# Patient Record
Sex: Male | Born: 1955 | Race: Black or African American | Hispanic: No | Marital: Single | Smoking: Current every day smoker
Health system: Southern US, Community
[De-identification: ages and names within clinical notes are randomized; demographics above are authoritative.]

## PROBLEM LIST (undated history)

## (undated) DIAGNOSIS — E119 Type 2 diabetes mellitus without complications: Secondary | ICD-10-CM

## (undated) HISTORY — DX: Type 2 diabetes mellitus without complications: E11.9

---

## 1998-05-21 ENCOUNTER — Ambulatory Visit (HOSPITAL_COMMUNITY): Admission: RE | Admit: 1998-05-21 | Discharge: 1998-05-21 | Payer: Self-pay | Admitting: Neurosurgery

## 2003-10-11 ENCOUNTER — Inpatient Hospital Stay (HOSPITAL_COMMUNITY): Admission: AD | Admit: 2003-10-11 | Discharge: 2003-10-13 | Payer: Self-pay | Admitting: Family Medicine

## 2004-01-20 ENCOUNTER — Encounter: Admission: RE | Admit: 2004-01-20 | Discharge: 2004-01-20 | Payer: Self-pay | Admitting: Internal Medicine

## 2004-08-27 ENCOUNTER — Ambulatory Visit: Payer: Self-pay | Admitting: Gastroenterology

## 2004-09-07 ENCOUNTER — Ambulatory Visit (HOSPITAL_COMMUNITY): Admission: RE | Admit: 2004-09-07 | Discharge: 2004-09-07 | Payer: Self-pay | Admitting: Gastroenterology

## 2004-09-07 ENCOUNTER — Encounter (INDEPENDENT_AMBULATORY_CARE_PROVIDER_SITE_OTHER): Payer: Self-pay | Admitting: *Deleted

## 2004-11-19 ENCOUNTER — Ambulatory Visit: Payer: Self-pay | Admitting: Gastroenterology

## 2004-12-24 ENCOUNTER — Ambulatory Visit: Payer: Self-pay | Admitting: Gastroenterology

## 2005-01-03 ENCOUNTER — Ambulatory Visit: Payer: Self-pay | Admitting: Gastroenterology

## 2005-01-17 ENCOUNTER — Ambulatory Visit: Payer: Self-pay | Admitting: Gastroenterology

## 2005-01-31 ENCOUNTER — Ambulatory Visit: Payer: Self-pay | Admitting: Internal Medicine

## 2005-02-21 ENCOUNTER — Ambulatory Visit (HOSPITAL_COMMUNITY): Admission: RE | Admit: 2005-02-21 | Discharge: 2005-02-21 | Payer: Self-pay | Admitting: Internal Medicine

## 2005-02-21 ENCOUNTER — Ambulatory Visit: Payer: Self-pay | Admitting: Internal Medicine

## 2005-03-21 ENCOUNTER — Ambulatory Visit: Payer: Self-pay | Admitting: Internal Medicine

## 2005-04-18 ENCOUNTER — Ambulatory Visit: Payer: Self-pay | Admitting: Internal Medicine

## 2005-05-16 ENCOUNTER — Ambulatory Visit: Payer: Self-pay | Admitting: Gastroenterology

## 2010-11-10 ENCOUNTER — Encounter: Payer: Self-pay | Admitting: Gastroenterology

## 2010-11-11 ENCOUNTER — Encounter: Payer: Self-pay | Admitting: Gastroenterology

## 2011-03-08 NOTE — H&P (Signed)
NAMEDISHON, KEHOE                          ACCOUNT NO.:  [id number]   MEDICAL RECORD NO.:  [id number]                   PATIENT TYPE:  EMS   LOCATION:  URG                                  FACILITY:  [facility]   PHYSICIAN:  Deirdre Peer. Polite, M.D.              DATE OF BIRTH:  Dec 16, 1955   DATE OF ADMISSION:  10/11/2003  DATE OF DISCHARGE:                                HISTORY & PHYSICAL   CHIEF COMPLAINT:  No energy.   HISTORY OF PRESENT ILLNESS:  This 55 year old black male whose only  significant past medical history of left jaw abscess which was treated with  Penicillin December 14.  He was given 3 tabs a day of a pain pill.  Otherwise he has no other past medical history.  He does admit to having  decreased energy, weight loss of approximately 50 pounds, decreased  appetite, polyuria and polydipsia for approximately 2 months.  The patient  presented to urgent care for evaluation and was found to have an elevated  CBG of greater than 400; therefore, he was referred to the [facility] ED for further evaluation.  In the ED the patient was given IV  fluids and labs were ordered which confirmed hyperglycemia and hyponatremia.  Because of the patient's sallow appearance and history of weight loss [facility]  hospitalist was called for further evaluation and admission.  At the time of  my evaluation the patient was alert and oriented in no apparent distress,  but appeared in a very weakened state.  He stated that he had not been  feeling good for a couple of months.  He thought that it was related to his  jaw abscess. He states that his appetite has been down. He has occasionally  had some nausea and overall is just not feeling good.  Again, he does admit  to polyuria, polydipsia, but not recently, just feeling very weak.  He  denied any fever or chills. No vomiting.  No abdominal pain.  Admitted to  occasional constipation, but no blood in the stool or urine.  The patient  did  admit to having to occasionally use hemorrhoidal medications.   PAST MEDICAL HISTORY:  As stated above.   MEDICATIONS ON ADMISSION:  Penicillin 3 times a day since December 14, and  occasions pain pill.   SOCIAL HISTORY:  The patient denies tobacco for the past couple of months  since he has not been feeling good.  Prior to that he had been smoking 1/2  pack per day.  Again, also denies alcohol over the past couple of months  since he has not felt good.  Prior to that he states that he drank about a 6-  pack a day.  Denies any surgeries.   ALLERGIES:  No known drug allergies.   FAMILY HISTORY:  States his mother had a history of CVA and hypertension.  Father had a history  of coronary artery disease.  States that he has 3  brothers and 2 sisters who are healthy.   PHYSICAL EXAMINATION:  GENERAL:  The patient was alert and oriented x3 in  mild distress.  VITAL SIGNS:  Temperature on admission 100.2; at the time of my evaluation  98.6.  BP 106/79.  Pulse 99, respiratory rate is 16.  The patient saturation  is 98%.  HEENT:  Anicteric sclerae.  Dry oral mucosa.  The patient has fullness over  his left parotid gland.  No discharge could be expressed from the duct.  There was no obvious internal abscess at the gum line.  The patient has poor  dentition.  CHEST:  Clear to auscultation bilaterally.  No rales, no wheezes.  CARDIOVASCULAR:  Regular rate and rhythm.  ABDOMEN:  Soft, nontender, no hepatosplenomegaly.  EXTREMITIES:  No edema.  2+ pulse.  RECTAL:  Exam deferred.   LABORATORY DATA:  BMET:  Sodium 127, potassium 4.5, chloride 87, BUN 21,  glucose of 437.  No other labs are available at this time.   ASSESSMENT AND PLAN:  1. Hyperglycemia, probably newly diagnosed diabetes.  2. Dehydration.  3. History of oral abscess, cannot exclude parotid gland infection.  4. History of weight loss 50 pounds.  5. Hyponatremia.   RECOMMENDATIONS:  The patient will be admitted to a nursing  floor bed for  evaluation and treatment; will be given judicious IV fluid.  Will have  screening lab work; will obtain a CBC with differential; a CMET, a UA, CNS,  as well as TSH, hemoglobin A1c and a lipid panel.  The patient will also  have a chest x-ray ordered and may consider ordering imaging of his left  parotid gland as there is fullness and discomfort, however, this may just be  a mild inflammation of the parotid gland secondary to severe dehydration.  Will make further recommendations after review of the above studies.                                                Deirdre Peer. Polite, M.D.    RDP/MEDQ  D:  10/11/2003  T:  10/11/2003  Job:  [id number]

## 2011-03-08 NOTE — Discharge Summary (Signed)
[name], [name]                          ACCOUNT NO.:  [id number]   MEDICAL RECORD NO.:  [id number]                   PATIENT TYPE:  INP   LOCATION:  5741                                 FACILITY:  [facility]   PHYSICIAN:  Deirdre Peer. Polite, M.D.              DATE OF BIRTH:  10-Dec-1955   DATE OF ADMISSION:  10/11/2003  DATE OF DISCHARGE:  10/13/2003                                 DISCHARGE SUMMARY   DISCHARGE DIAGNOSES:  1. Diabetes, newly diagnosed.  Hemoglobin A1C 14.8.  2. Elevated liver function tests with AST 121, ALT 157, for outpatient     followup.  3. Azotemia, resolved.  Creatinine on admission 1.7, now 1.3.  4. Submandibular swelling of gland versus abscess.  The patient is afebrile,     to continue outpatient management. Follow up with outpatient physician.   DISCHARGE MEDICATIONS:  1. Insulin 70/30, 15 units b.i.d.  2. Glyburide 5 mg p.o. b.i.d.  3. Recommend patient take baby aspirin 81 mg 1 p.o. daily.  4. The patient is to continue outpatient antibiotics, penicillin VK 500 mg 1     p.o. t.i.d.   CONSULTATIONS:  None.   STUDIES:  1. Hemoglobin A1C 14.8.  2. CBC: White count 4.2, hemoglobin 13.8, platelets 141.  3. BMET significant for creatinine of 1.7 on admission. Followup creatinine     1.3.  AST 120, ALT 157.  Total cholesterol 175, HDL 20, LDL 52.  UA     significant for greater than 1000 glucose.  Leukocyte esterase and     nitrite negative.  4. Chest x-ray:  No acute disease.   DISPOSITION:  The patient discharged to home in stable condition with  outpatient followup.   HISTORY OF PRESENT ILLNESS:  A 55 year old male with past medical history  significant for ETOH and tobacco use, presented to the ED with CBG greater  than 400.  The patient did not carry the diagnosis of diabetes.  Also of  note, the patient had been taking medication for a left jaw abscess;  however, upon further review, had not really been taking his antibiotics.  Because of the  newly diagnosed diabetes and the patient appearing extremely  dehydrated, admission was deemed necessary for further evaluation and  treatment.  Please see dictated HPI for further details of History of  Present Illness.   HOSPITAL COURSE:  #1.  NEWLY DIAGNOSED DIABETES:  The patient was admitted  to a medicine floor bed for evaluation and treatment of newly diagnosed  diabetes.  The patient had screening labs: CBC, BMET, hemoglobin A1C, and  lipid panel. Lab results as stated in the Studies section.  The patient was  given IV fluids and insulin.  Initially sliding scale insulin, ultimately  converted to 70/30 insulin, 15 units b.i.d.  The patient had dramatic  improvement in his symptoms of malaise and weakness, tolerated his diet.  On  the next day of  admission, the patient was hemodynamically stable; however,  CBGs were still greater than optimal in the mid 200 range.  At this time,  the patient's medications will continue to be titrated; insulin 70/30 will  stay at 20 units b.i.d. and will add oral hyperglycemics.  His medications  will be further titrated on an outpatient basis by his primary MD.   #2.  QUESTIONABLE LEFT JAW ABSCESS:  Per exam, the patient had fullness in  the left parotid gland which improved dramatically post admission as well as  fullness in the left submandibular gland area.  There was no obvious  discharge.  The patient may have an abscess in that area.  The patient has  been on antibiotics on outpatient basis, taken for only two days.  In the  interim, he was in the hospital on IV clindamycin.  The patient has remained  afebrile without leukocytosis.  The patient was asked to continue penicillin  VK 500 mg t.i.d. x 10 days and to follow up with his outpatient MD.   #3.  MILDLY ELEVATED LIVER FUNCTION TESTS:  As stated on admission, the  patient has history of alcohol use daily, approximately 6 beers a day.  This  is most likely related to that.  The patient is  without nausea and vomiting,  without jaundice.  This will be followed up on an outpatient basis.  At this  time, the patient is medically stable at discharge.                                                Deirdre Peer. Polite, M.D.    RDP/MEDQ  D:  10/13/2003  T:  10/15/2003  Job:  [id number]

## 2021-11-27 ENCOUNTER — Ambulatory Visit
Admission: RE | Admit: 2021-11-27 | Discharge: 2021-11-27 | Disposition: A | Discharge status: Home / Self Care | Payer: Self-pay | Source: Ambulatory Visit | Attending: *Deleted | Admitting: *Deleted

## 2021-11-27 ENCOUNTER — Other Ambulatory Visit: Payer: Self-pay | Admitting: *Deleted

## 2021-11-27 DIAGNOSIS — M25512 Pain in left shoulder: Secondary | ICD-10-CM

## 2022-02-04 ENCOUNTER — Other Ambulatory Visit (HOSPITAL_COMMUNITY): Payer: Self-pay | Admitting: Family Medicine

## 2022-02-05 ENCOUNTER — Other Ambulatory Visit (HOSPITAL_COMMUNITY): Payer: Self-pay | Admitting: Family Medicine

## 2022-02-05 DIAGNOSIS — E1165 Type 2 diabetes mellitus with hyperglycemia: Secondary | ICD-10-CM

## 2022-02-22 ENCOUNTER — Ambulatory Visit (HOSPITAL_COMMUNITY)
Admission: RE | Admit: 2022-02-22 | Discharge: 2022-02-22 | Disposition: A | Discharge status: Home / Self Care | Payer: Medicare Other | Source: Ambulatory Visit | Attending: Family Medicine | Admitting: Family Medicine

## 2022-02-22 DIAGNOSIS — E1165 Type 2 diabetes mellitus with hyperglycemia: Secondary | ICD-10-CM | POA: Diagnosis not present

## 2022-02-22 DIAGNOSIS — I517 Cardiomegaly: Secondary | ICD-10-CM | POA: Insufficient documentation

## 2022-02-22 DIAGNOSIS — I5030 Unspecified diastolic (congestive) heart failure: Secondary | ICD-10-CM | POA: Diagnosis not present

## 2022-02-22 LAB — ECHOCARDIOGRAM COMPLETE
AV Peak grad: 5.1 mmHg
Ao pk vel: 1.13 m/s
Area-P 1/2: 2.97 cm2
S' Lateral: 2.6 cm

## 2022-10-29 ENCOUNTER — Other Ambulatory Visit: Payer: Self-pay | Admitting: *Deleted

## 2022-10-29 DIAGNOSIS — M79605 Pain in left leg: Secondary | ICD-10-CM

## 2022-11-12 ENCOUNTER — Ambulatory Visit: Payer: Medicare Other | Admitting: Physician Assistant

## 2022-11-12 ENCOUNTER — Ambulatory Visit (HOSPITAL_COMMUNITY)
Admission: RE | Admit: 2022-11-12 | Discharge: 2022-11-12 | Disposition: A | Discharge status: Home / Self Care | Payer: Medicare Other | Source: Ambulatory Visit | Attending: Vascular Surgery | Admitting: Vascular Surgery

## 2022-11-12 VITALS — BP 116/70 | HR 72 | Temp 98.1°F | Resp 20 | Ht 67.0 in | Wt 160.0 lb

## 2022-11-12 DIAGNOSIS — M79605 Pain in left leg: Secondary | ICD-10-CM | POA: Insufficient documentation

## 2022-11-12 DIAGNOSIS — I83813 Varicose veins of bilateral lower extremities with pain: Secondary | ICD-10-CM

## 2022-11-12 DIAGNOSIS — M79604 Pain in right leg: Secondary | ICD-10-CM | POA: Diagnosis not present

## 2022-11-12 DIAGNOSIS — I8393 Asymptomatic varicose veins of bilateral lower extremities: Secondary | ICD-10-CM

## 2022-11-12 NOTE — Progress Notes (Signed)
Requested by:  Ellyn Hack, MD [address] [place],  [place] [address]  Reason for consultation: varicose veins    History of Present Illness   [name] is a 67 y.o. (28-Dec-1955) male who presents for evaluation of varicose veins.  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  The veins in his right leg are worse than the left.  They occasionally cause him aching pain at the end of the day after being on his feet.  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  He states that his legs will also feel tired at the end of the day.  He denies any noticeable leg swelling.  He occasionally wears knee-high compression stockings and experiences benefit from them.  He does not elevate his legs.  He denies any history of ulcers, DVT, bleeding, or history of previous vein procedures.  Past Medical History:  Diagnosis Date   Diabetes mellitus without complication (HCC)     History reviewed. No pertinent surgical history.  Social History   Socioeconomic History   Marital status: Married    Spouse name: Not on file   Number of children: Not on file   Years of education: Not on file   Highest education level: Not on file  Occupational History   Not on file  Tobacco Use   Smoking status: Every Day    Packs/day: 1.00    Types: Cigarettes   Smokeless tobacco: Never  Substance and Sexual Activity   Alcohol use: Yes   Drug use: Not Currently   Sexual activity: Not on file  Other Topics Concern   Not on file  Social History Narrative   Not on file   Social Determinants of Health   Financial Resource Strain: Not on file  Food Insecurity: Not on file  Transportation Needs: Not on file  Physical Activity: Not on file  Stress: Not on file  Social Connections: Not on file  Intimate Partner Violence: Not on file   History reviewed. No pertinent family history.  Current Outpatient Medications  Medication Sig Dispense Refill    gabapentin (NEURONTIN) 100 MG capsule Take 100 mg by mouth 2 (two) times daily.     metFORMIN (GLUCOPHAGE) 1000 MG tablet SMARTSIG:1 Tablet(s) By Mouth Morning-Evening     No current facility-administered medications for this visit.    No Known Allergies  REVIEW OF SYSTEMS (negative unless checked):   Cardiac:  []  Chest pain or chest pressure? []  Shortness of breath upon activity? []  Shortness of breath when lying flat? []  Irregular heart rhythm?  Vascular:  []  Pain in calf, thigh, or hip brought on by walking? []  Pain in feet at night that wakes you up from your sleep? []  Blood clot in your veins? []  Leg swelling?  Pulmonary:  []  Oxygen at home? []  Productive cough? []  Wheezing?  Neurologic:  []  Sudden weakness in arms or legs? []  Sudden numbness in arms or legs? []  Sudden onset of difficult speaking or slurred speech? []  Temporary loss of vision in one eye? []  Problems with dizziness?  Gastrointestinal:  []  Blood in stool? []  Vomited blood?  Genitourinary:  []  Burning when urinating? []  Blood in urine?  Psychiatric:  []  Major depression  Hematologic:  []  Bleeding problems? []  Problems with blood clotting?  Dermatologic:  []  Rashes or ulcers?  Constitutional:  []  Fever or chills?  Ear/Nose/Throat:  []  Change in hearing? []   Nose bleeds? []  Sore throat?  Musculoskeletal:  []  Back pain? []  Joint pain? []  Muscle pain?   Physical Examination     Vitals:   11/12/22 1249  BP: 116/70  Pulse: 72  Resp: 20  Temp: 98.1 F (36.7 C)  SpO2: 100%  Weight: 160 lb (72.6 kg)  Height: 5\' 7"  (1.702 m)   Body mass index is 25.06 kg/m.  General:  WDWN in NAD; vital signs documented above Gait: Not observed HENT: WNL, normocephalic Pulmonary: normal non-labored breathing Cardiac: regular rate and rhythm Abdomen: soft, NT, no masses Skin: without rashes Vascular Exam/Pulses: bilateral feet warm and well perfused Extremities: Numerous varicose veins  in bilateral calves, right greater than left.  No reticular veins, no edema, no stasis pigmentation, no lipodermatosclerosis, no ulcers  Musculoskeletal: no muscle wasting or atrophy  Neurologic: A&O X 3;  No focal weakness or paresthesias are detected Psychiatric:  The pt has Normal affect.  Non-invasive Vascular Imaging   RLE Venous Insufficiency Duplex (11/12/2022):   +--------------+---------+------+-----------+------------+--------+  RIGHT        Reflux NoRefluxReflux TimeDiameter cmsComments                          Yes                                   +--------------+---------+------+-----------+------------+--------+  CFV                    yes   >1 second                       +--------------+---------+------+-----------+------------+--------+  FV prox                 yes   >1 second                       +--------------+---------+------+-----------+------------+--------+  FV mid                  yes   >1 second                       +--------------+---------+------+-----------+------------+--------+  FV dist       no                                              +--------------+---------+------+-----------+------------+--------+  Popliteal    no                                              +--------------+---------+------+-----------+------------+--------+  GSV at [facility]              yes    >500 ms      1.22              +--------------+---------+------+-----------+------------+--------+  GSV prox thigh          yes    >500 ms      0.81              +--------------+---------+------+-----------+------------+--------+  GSV mid thigh           yes    >  500 ms      1.4               +--------------+---------+------+-----------+------------+--------+  GSV dist thigh          yes    >500 ms      0.75              +--------------+---------+------+-----------+------------+--------+  GSV at knee             yes     >500 ms      0.73              +--------------+---------+------+-----------+------------+--------+  GSV prox calf           yes    >500 ms      0.61              +--------------+---------+------+-----------+------------+--------+  SSV Pop Fossa no                           0.228              +--------------+---------+------+-----------+------------+--------+  SSV prox calf           yes    >500 ms     0.306              +--------------+---------+------+-----------+------------+--------+  SSV mid calf            yes    >500 ms     0.365              +--------------+---------+------+-----------+------------+--------+    Medical Decision Making   [name] is a 67 y.o. male who presents with painful varicose veins  Based on the patient's duplex study, there is reflux in the right common femoral vein, femoral vein, greater saphenous vein beginning at the saphenofemoral junction and extending to the proximal calf, and small saphenous vein in the proximal and mid calf.  The rest of the superficial and deep venous system is competent.  There is no evidence of DVT or SVT. 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  He experiences aching in these veins at the end of the day after being on his feet.  He experiences relief with compression stockings.  He wanted to ensure there was nothing else he could do to make his symptoms better. I believe he would benefit from continued use of compression stockings to help with aching in his varicose veins.  I also educated him on the proper way to elevate his legs to help with aching and heaviness. He can follow-up with our office as needed.   [name] Ivan Alexanders, PA-C [facility] Office: [phone]  11/12/2022, 4:23 PM  Clinic MD: Carlis Abbott

## 2023-05-01 IMAGING — CR DG SHOULDER 2+V*L*
4 series · 4 of 4 positions shown · non-contrast
Comparison: No recent prior.

CLINICAL DATA: Left shoulder pain.

EXAM:
LEFT SHOULDER - 2+ VIEW

[w shoulder ap internal left *]
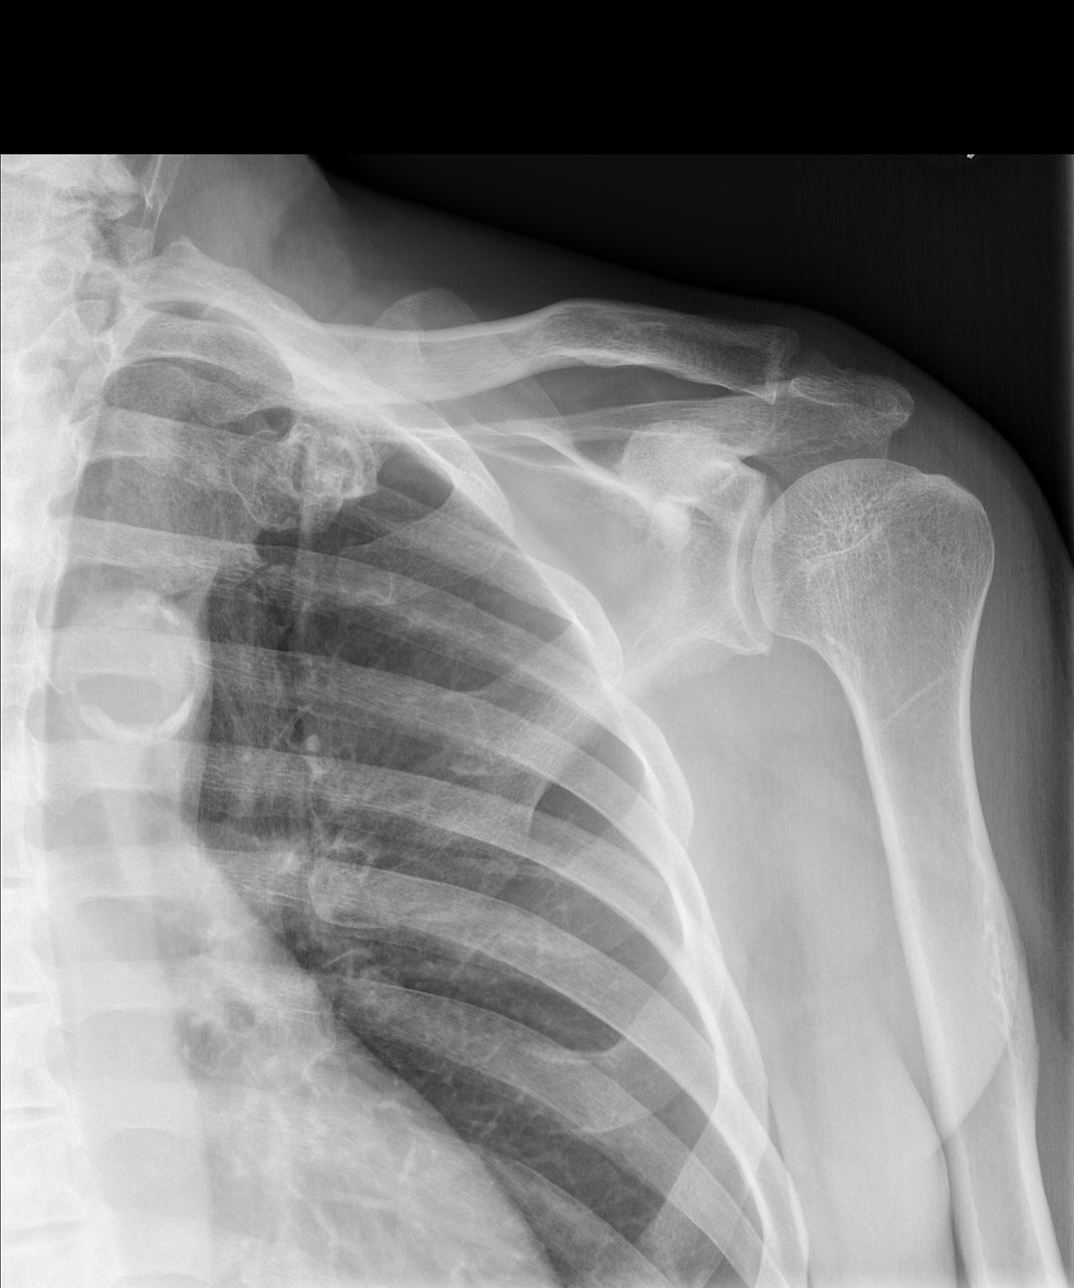

[w shoulder y view left * (1 of 2)]
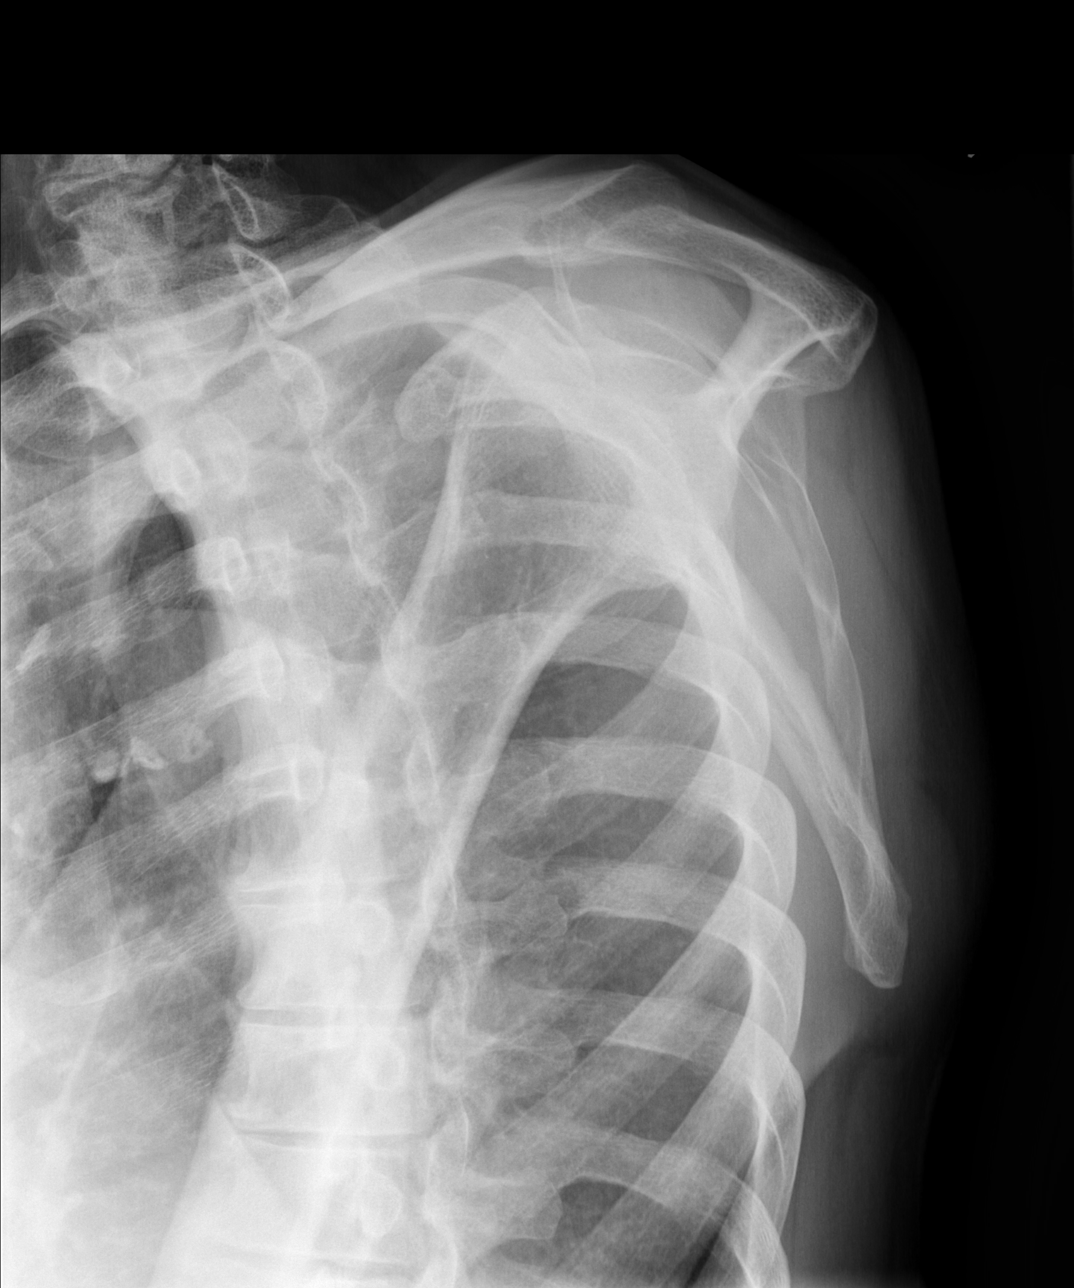

[w shoulder y view left * (2 of 2)]
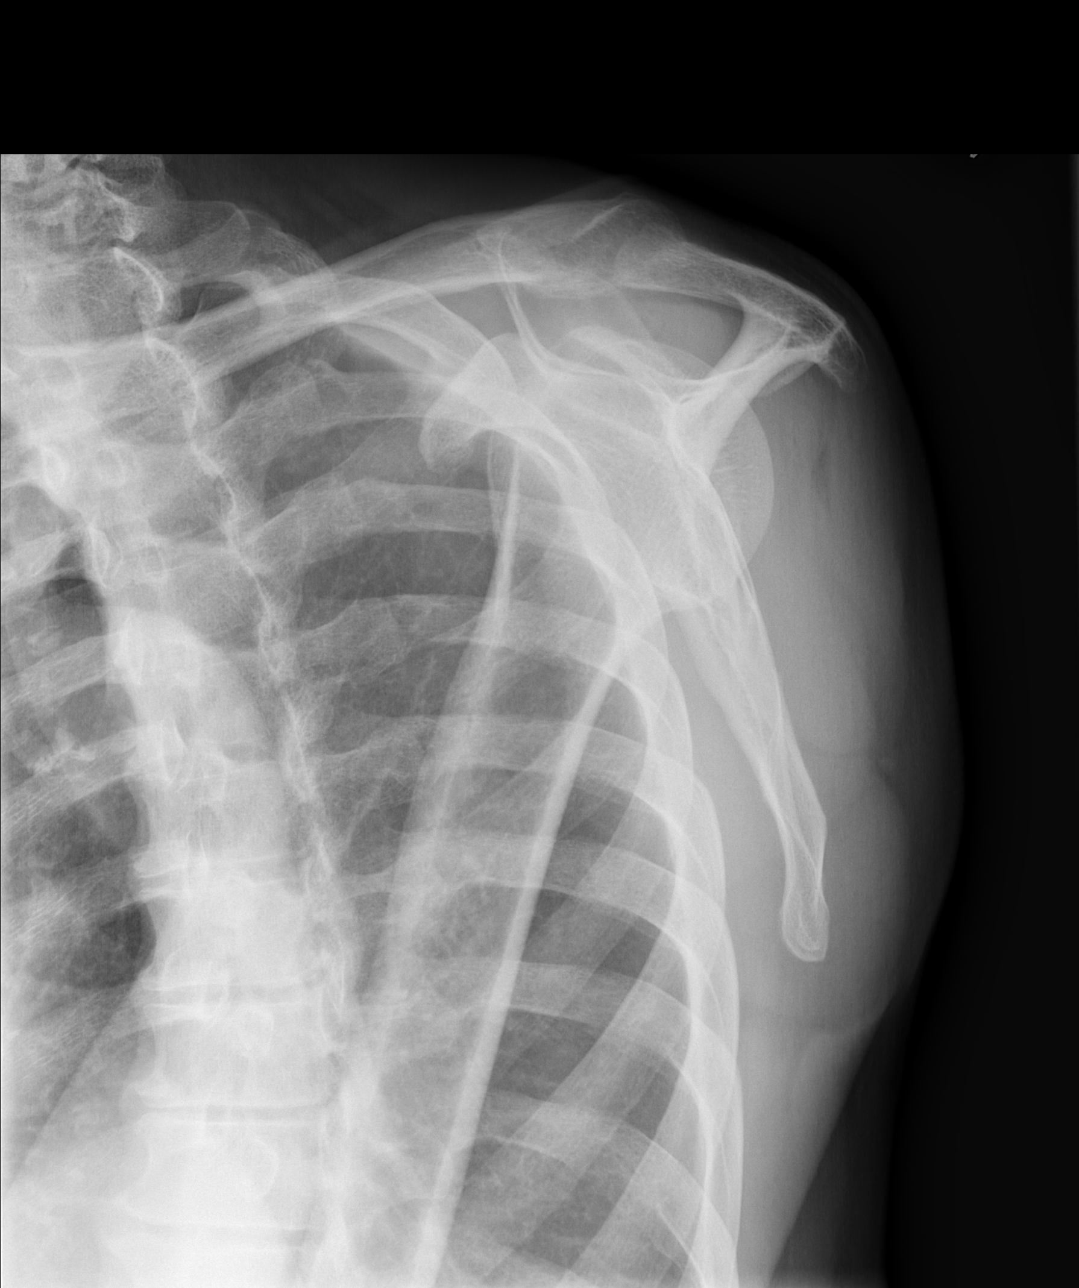

[w shoulder axillary left *]
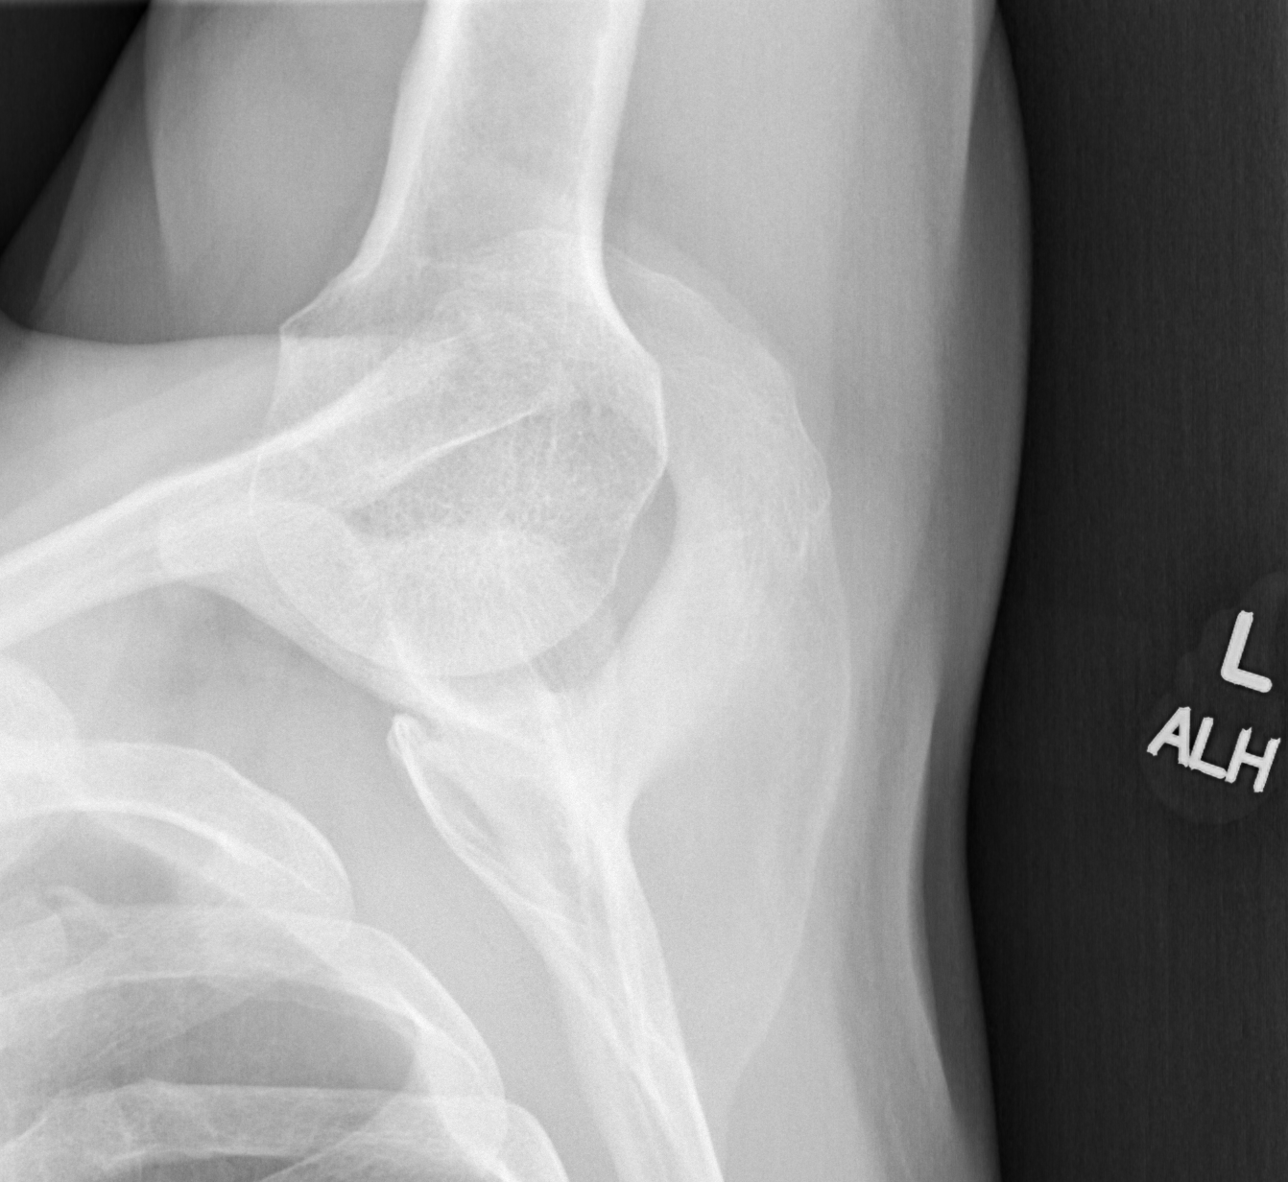

[4 of 4 positions shown; findings below may reference images not displayed]

FINDINGS: Acromioclavicular and glenohumeral degenerative change. No evidence
of fracture, dislocation, or separation. No acute bony abnormality
identified. Aortic atherosclerotic vascular calcification.
IMPRESSION: 1. Acromioclavicular glenohumeral degenerative change. No acute
abnormality.

2.  Aortic atherosclerotic vascular disease.

## 2024-10-28 ENCOUNTER — Other Ambulatory Visit (HOSPITAL_BASED_OUTPATIENT_CLINIC_OR_DEPARTMENT_OTHER): Payer: Self-pay | Admitting: Adult Health Nurse Practitioner

## 2024-10-28 DIAGNOSIS — F17210 Nicotine dependence, cigarettes, uncomplicated: Secondary | ICD-10-CM

## 2024-10-28 DIAGNOSIS — R0989 Other specified symptoms and signs involving the circulatory and respiratory systems: Secondary | ICD-10-CM

## 2024-11-03 ENCOUNTER — Ambulatory Visit (HOSPITAL_BASED_OUTPATIENT_CLINIC_OR_DEPARTMENT_OTHER)
Admission: RE | Admit: 2024-11-03 | Discharge: 2024-11-03 | Disposition: A | Discharge status: Home / Self Care | Source: Ambulatory Visit | Attending: Adult Health Nurse Practitioner | Admitting: Adult Health Nurse Practitioner

## 2024-11-03 ENCOUNTER — Ambulatory Visit (HOSPITAL_BASED_OUTPATIENT_CLINIC_OR_DEPARTMENT_OTHER)
Admission: RE | Admit: 2024-11-03 | Discharge: 2024-11-03 | Attending: Adult Health Nurse Practitioner | Admitting: Adult Health Nurse Practitioner

## 2024-11-03 DIAGNOSIS — R0989 Other specified symptoms and signs involving the circulatory and respiratory systems: Secondary | ICD-10-CM

## 2024-11-03 DIAGNOSIS — F17210 Nicotine dependence, cigarettes, uncomplicated: Secondary | ICD-10-CM | POA: Diagnosis present
